# Patient Record
Sex: Female | Born: 1995 | Race: White | Hispanic: No | Marital: Single | Smoking: Never smoker
Health system: Southern US, Community
[De-identification: ages and names within clinical notes are randomized; demographics above are authoritative.]

## PROBLEM LIST (undated history)

## (undated) DIAGNOSIS — R109 Unspecified abdominal pain: Secondary | ICD-10-CM

## (undated) DIAGNOSIS — Z803 Family history of malignant neoplasm of breast: Secondary | ICD-10-CM

## (undated) DIAGNOSIS — G510 Bell's palsy: Secondary | ICD-10-CM

## (undated) DIAGNOSIS — L409 Psoriasis, unspecified: Secondary | ICD-10-CM

## (undated) DIAGNOSIS — L404 Guttate psoriasis: Secondary | ICD-10-CM

## (undated) HISTORY — PX: NO PAST SURGERIES: SHX2092

## (undated) HISTORY — PX: WISDOM TOOTH EXTRACTION: SHX21

## (undated) HISTORY — DX: Bell's palsy: G51.0

## (undated) HISTORY — DX: Family history of malignant neoplasm of breast: Z80.3

## (undated) HISTORY — DX: Unspecified abdominal pain: R10.9

## (undated) HISTORY — DX: Guttate psoriasis: L40.4

---
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
## 2017-03-25 NOTE — Telephone Encounter (Signed)
RN to get more info on bladder sx. At her age, she may need to see urology instead and can do ref before her annual appt.

## 2017-03-25 NOTE — Telephone Encounter (Signed)
Pt has scheduled annual exam for 10/1st.  She also wants to talk to you about bladder control options.  Front desk told her to ask on nurse line if she needs a longer appt time.  Please adv. [phone]

## 2017-03-26 NOTE — Telephone Encounter (Signed)
Mail box is full. Will try later

## 2017-03-31 NOTE — Telephone Encounter (Signed)
This encounter was created in error - please disregard.

## 2017-06-08 ENCOUNTER — Ambulatory Visit: Payer: Self-pay | Admitting: Obstetrics and Gynecology

## 2019-02-25 ENCOUNTER — Ambulatory Visit (INDEPENDENT_AMBULATORY_CARE_PROVIDER_SITE_OTHER): Payer: BC Managed Care – PPO | Admitting: Obstetrics and Gynecology

## 2019-02-25 ENCOUNTER — Encounter: Payer: Self-pay | Admitting: Obstetrics and Gynecology

## 2019-02-25 ENCOUNTER — Other Ambulatory Visit (HOSPITAL_COMMUNITY)
Admission: RE | Admit: 2019-02-25 | Discharge: 2019-02-25 | Disposition: A | Discharge status: Home / Self Care | Payer: BC Managed Care – PPO | Source: Ambulatory Visit | Attending: Obstetrics and Gynecology | Admitting: Obstetrics and Gynecology

## 2019-02-25 ENCOUNTER — Other Ambulatory Visit: Payer: Self-pay

## 2019-02-25 VITALS — BP 110/62 | Ht 63.0 in | Wt 142.0 lb

## 2019-02-25 DIAGNOSIS — Z124 Encounter for screening for malignant neoplasm of cervix: Secondary | ICD-10-CM

## 2019-02-25 DIAGNOSIS — Z113 Encounter for screening for infections with a predominantly sexual mode of transmission: Secondary | ICD-10-CM | POA: Insufficient documentation

## 2019-02-25 DIAGNOSIS — Z3009 Encounter for other general counseling and advice on contraception: Secondary | ICD-10-CM

## 2019-02-25 DIAGNOSIS — Z01419 Encounter for gynecological examination (general) (routine) without abnormal findings: Secondary | ICD-10-CM

## 2019-02-25 MED ORDER — LEVONORGESTREL-ETHINYL ESTRAD 0.1-20 MG-MCG PO TABS: 1.0000 | ORAL_TABLET | Freq: Every day | ORAL | 11 refills | Status: DC

## 2019-02-25 NOTE — Progress Notes (Signed)
Gynecology Annual Exam  PCP: Gregary Signs, MD  Chief Complaint:  Chief Complaint  Patient presents with  . Gynecologic Exam    Wants to get on BC    History of Present Illness: Patient is a 23 y.o. G0P0000 presents for annual exam. The patient has no complaints today.   LMP: Patient's last menstrual period was 02/24/2019 (exact date). Menarche:14 Average Interval: regular, 28 days Duration of flow: 4 days Heavy Menses: moderate Clots: no Intermenstrual Bleeding: no Postcoital Bleeding: no Dysmenorrhea: moderate  The patient is sexually active. She currently uses none for contraception. She denies dyspareunia.  The patient does not perform self breast exams.  There is notable family history of breast or ovarian cancer in her family.  The patient wears seatbelts: yes.  The patient has regular exercise: yes.    The patient denies current symptoms of depression.    Review of Systems: Review of Systems  Constitutional: Negative for chills, fever, malaise/fatigue and weight loss.  HENT: Negative for congestion, hearing loss and sinus pain.   Eyes: Negative for blurred vision and double vision.  Respiratory: Negative for cough, sputum production, shortness of breath and wheezing.   Cardiovascular: Negative for chest pain, palpitations, orthopnea and leg swelling.  Gastrointestinal: Negative for abdominal pain, constipation, diarrhea, nausea and vomiting.  Genitourinary: Negative for dysuria, flank pain, frequency, hematuria and urgency.  Musculoskeletal: Negative for back pain, falls and joint pain.  Skin: Negative for itching and rash.  Neurological: Negative for dizziness and headaches.  Psychiatric/Behavioral: Negative for depression, substance abuse and suicidal ideas. The patient is not nervous/anxious.     Past Medical History:  Past Medical History:  Diagnosis Date  . Abdominal pain   . Bell palsy     Past Surgical History:  History reviewed. No pertinent  surgical history.  Gynecologic History:  Patient's last menstrual period was 02/24/2019 (exact date). Contraception: none Last Pap: never  Obstetric History: G0P0000  Family History:  Family History  Problem Relation Age of Onset  . Migraines Brother   . Migraines Maternal Aunt     Social History:  Social History   Socioeconomic History  . Marital status: Single    Spouse name: Not on file  . Number of children: Not on file  . Years of education: Not on file  . Highest education level: Not on file  Occupational History  . Not on file  Social Needs  . Financial resource strain: Not on file  . Food insecurity    Worry: Not on file    Inability: Not on file  . Transportation needs    Medical: Not on file    Non-medical: Not on file  Tobacco Use  . Smoking status: Never Smoker  . Smokeless tobacco: Never Used  Substance and Sexual Activity  . Alcohol use: No  . Drug use: No  . Sexual activity: Yes    Birth control/protection: None, Condom  Lifestyle  . Physical activity    Days per week: Not on file    Minutes per session: Not on file  . Stress: Not on file  Relationships  . Social Herbalist on phone: Not on file    Gets together: Not on file    Attends religious service: Not on file    Active member of club or organization: Not on file    Attends meetings of clubs or organizations: Not on file    Relationship status: Not on file  .  Intimate partner violence    Fear of current or ex partner: Not on file    Emotionally abused: Not on file    Physically abused: Not on file    Forced sexual activity: Not on file  Other Topics Concern  . Not on file  Social History Narrative  . Not on file    Allergies:  Allergies  Allergen Reactions  . Amoxicillin Rash    Medications: Prior to Admission medications   Medication Sig Start Date End Date Taking? Authorizing Provider  loratadine (CLARITIN) 10 MG tablet Take 10 mg by mouth daily.   Yes  [provider]  Multiple Vitamin (MULTIVITAMIN) tablet Take 1 tablet by mouth daily.   Yes [provider]  sertraline (ZOLOFT) 50 MG tablet TAKE 1 TABLET BY MOUTH ONCE DAILY (TAKE WITH A 25MG  TABLET FOR A TOTAL DAILY DOSE OF 75MG ) 11/24/18  Yes [provider]    Physical Exam Vitals: Blood pressure 110/62, height 5\' 3"  (1.6 m), weight 142 lb (64.4 kg), last menstrual period 02/24/2019.  General: NAD HEENT: normocephalic, anicteric Thyroid: no enlargement, no palpable nodules Pulmonary: No increased work of breathing, CTAB Cardiovascular: RRR, distal pulses 2+ Breast: Breast symmetrical, no tenderness, no palpable nodules or masses, no skin or nipple retraction present, no nipple discharge.  No axillary or supraclavicular lymphadenopathy. Abdomen: NABS, soft, non-tender, non-distended.  Umbilicus without lesions.  No hepatomegaly, splenomegaly or masses palpable. No evidence of hernia  Genitourinary:  External: Normal external female genitalia.  Normal urethral meatus, normal Bartholin's and Skene's glands.    Vagina: Normal vaginal mucosa, no evidence of prolapse.    Cervix: Grossly normal in appearance, no bleeding  Uterus: Non-enlarged, mobile, normal contour.  No CMT  Adnexa: ovaries non-enlarged, no adnexal masses  Rectal: deferred  Lymphatic: no evidence of inguinal lymphadenopathy Extremities: no edema, erythema, or tenderness Neurologic: Grossly intact Psychiatric: mood appropriate, affect full  Female chaperone present for pelvic and breast  portions of the physical exam    Assessment: 23 y.o. G0P0000 routine annual exam  Plan: Problem List Items Addressed This Visit    None    Visit Diagnoses    Screening for cervical cancer    -  Primary   Relevant Orders   Cytology - PAP   Screening examination for STD (sexually transmitted disease)       Relevant Orders   Cervicovaginal ancillary only   Birth control counseling       Relevant  Medications   levonorgestrel-ethinyl estradiol (ALESSE) 0.1-20 MG-MCG tablet      1) 4) Gardasil Series discussed and if applicable offered to patient - Patient thinks that she has previously completed 3 shot series   2) STI screening  was offered and accepted  3)  ASCCP guidelines and rational discussed.  Patient opts for every 3 years screening interval  4) Contraception - the patient is currently using  none.  She is interested in starting Contraception: Would like to start with the contraceptive pill We discussed safe sex practices to reduce her furture risk of STI's.    5) Discussed hereditary breast cancer screening- patient will consider.   6)  Return in about 1 year (around 02/25/2020).  Adelene Idlerhristanna Irma Roulhac MD [facility], [facility] 02/25/2019 1:04 PM

## 2019-02-25 NOTE — Patient Instructions (Signed)

## 2019-02-28 LAB — CERVICOVAGINAL ANCILLARY ONLY
Chlamydia: NEGATIVE
Neisseria Gonorrhea: NEGATIVE

## 2019-03-01 LAB — CYTOLOGY - PAP: Diagnosis: NEGATIVE

## 2019-03-01 NOTE — Progress Notes (Signed)
Negative, Released to mychart 

## 2019-03-01 NOTE — Progress Notes (Signed)
NIL, released to mychart

## 2020-02-03 ENCOUNTER — Other Ambulatory Visit: Payer: Self-pay

## 2020-02-03 DIAGNOSIS — Z3009 Encounter for other general counseling and advice on contraception: Secondary | ICD-10-CM

## 2020-02-03 MED ORDER — LEVONORGESTREL-ETHINYL ESTRAD 0.1-20 MG-MCG PO TABS: 1.0000 | ORAL_TABLET | Freq: Every day | ORAL | 0 refills | Status: DC

## 2020-03-01 ENCOUNTER — Other Ambulatory Visit (HOSPITAL_COMMUNITY)
Admission: RE | Admit: 2020-03-01 | Discharge: 2020-03-01 | Disposition: A | Discharge status: Home / Self Care | Payer: BC Managed Care – PPO | Source: Ambulatory Visit | Attending: Obstetrics and Gynecology | Admitting: Obstetrics and Gynecology

## 2020-03-01 ENCOUNTER — Ambulatory Visit (INDEPENDENT_AMBULATORY_CARE_PROVIDER_SITE_OTHER): Payer: BC Managed Care – PPO | Admitting: Obstetrics and Gynecology

## 2020-03-01 ENCOUNTER — Encounter: Payer: Self-pay | Admitting: Obstetrics and Gynecology

## 2020-03-01 ENCOUNTER — Other Ambulatory Visit: Payer: Self-pay

## 2020-03-01 VITALS — BP 116/72 | HR 88 | Resp 16 | Ht 63.0 in | Wt 149.4 lb

## 2020-03-01 DIAGNOSIS — Z0000 Encounter for general adult medical examination without abnormal findings: Secondary | ICD-10-CM

## 2020-03-01 DIAGNOSIS — Z113 Encounter for screening for infections with a predominantly sexual mode of transmission: Secondary | ICD-10-CM

## 2020-03-01 DIAGNOSIS — Z3009 Encounter for other general counseling and advice on contraception: Secondary | ICD-10-CM

## 2020-03-01 DIAGNOSIS — Z01419 Encounter for gynecological examination (general) (routine) without abnormal findings: Secondary | ICD-10-CM

## 2020-03-01 MED ORDER — LEVONORGESTREL-ETHINYL ESTRAD 0.1-20 MG-MCG PO TABS: 1.0000 | ORAL_TABLET | Freq: Every day | ORAL | 12 refills | Status: DC

## 2020-03-01 NOTE — Patient Instructions (Signed)
Institute of Medicine Recommended Dietary Allowances for Calcium and Vitamin D  Age (yr) Calcium Recommended Dietary Allowance (mg/day) Vitamin D Recommended Dietary Allowance (international units/day)  9-18 1,300 600  19-50 1,000 600  51-70 1,200 600  71 and older 1,200 800  Data from Institute of Medicine. Dietary reference intakes: calcium, vitamin D. [place], [place]: National Academies Press; 2011.     Exercising to Stay Healthy To become healthy and stay healthy, it is recommended that you do moderate-intensity and vigorous-intensity exercise. You can tell that you are exercising at a moderate intensity if your heart starts beating faster and you start breathing faster but can still hold a conversation. You can tell that you are exercising at a vigorous intensity if you are breathing much harder and faster and cannot hold a conversation while exercising. Exercising regularly is important. It has many health benefits, such as:  Improving overall fitness, flexibility, and endurance.  Increasing bone density.  Helping with weight control.  Decreasing body fat.  Increasing muscle strength.  Reducing stress and tension.  Improving overall health. How often should I exercise? Choose an activity that you enjoy, and set realistic goals. Your health care provider can help you make an activity plan that works for you. Exercise regularly as told by your health care provider. This may include:  Doing strength training two times a week, such as: ? Lifting weights. ? Using resistance bands. ? Push-ups. ? Sit-ups. ? Yoga.  Doing a certain intensity of exercise for a given amount of time. Choose from these options: ? A total of 150 minutes of moderate-intensity exercise every week. ? A total of 75 minutes of vigorous-intensity exercise every week. ? A mix of moderate-intensity and vigorous-intensity exercise every week. Children, pregnant women, people who have not exercised  regularly, people who are overweight, and older adults may need to talk with a health care provider about what activities are safe to do. If you have a medical condition, be sure to talk with your health care provider before you start a new exercise program. What are some exercise ideas? Moderate-intensity exercise ideas include:  Walking 1 mile (1.6 km) in about 15 minutes.  Biking.  Hiking.  Golfing.  Dancing.  Water aerobics. Vigorous-intensity exercise ideas include:  Walking 4.5 miles (7.2 km) or more in about 1 hour.  Jogging or running 5 miles (8 km) in about 1 hour.  Biking 10 miles (16.1 km) or more in about 1 hour.  Lap swimming.  Roller-skating or in-line skating.  Cross-country skiing.  Vigorous competitive sports, such as football, basketball, and soccer.  Jumping rope.  Aerobic dancing. What are some everyday activities that can help me to get exercise?  Yard work, such as: ? Pushing a lawn mower. ? Raking and bagging leaves.  Washing your car.  Pushing a stroller.  Shoveling snow.  Gardening.  Washing windows or floors. How can I be more active in my day-to-day activities?  Use stairs instead of an elevator.  Take a walk during your lunch break.  If you drive, park your car farther away from your work or school.  If you take public transportation, get off one stop early and walk the rest of the way.  Stand up or walk around during all of your indoor phone calls.  Get up, stretch, and walk around every 30 minutes throughout the day.  Enjoy exercise with a friend. Support to continue exercising will help you keep a regular routine of activity. What guidelines can   I follow while exercising?  Before you start a new exercise program, talk with your health care provider.  Do not exercise so much that you hurt yourself, feel dizzy, or get very short of breath.  Wear comfortable clothes and wear shoes with good support.  Drink plenty of  water while you exercise to prevent dehydration or heat stroke.  Work out until your breathing and your heartbeat get faster. Where to find more information  U.S. Department of Health and Human Services: www.hhs.gov  Centers for Disease Control and Prevention (CDC): www.cdc.gov Summary  Exercising regularly is important. It will improve your overall fitness, flexibility, and endurance.  Regular exercise also will improve your overall health. It can help you control your weight, reduce stress, and improve your bone density.  Do not exercise so much that you hurt yourself, feel dizzy, or get very short of breath.  Before you start a new exercise program, talk with your health care provider. This information is not intended to replace advice given to you by your health care provider. Make sure you discuss any questions you have with your health care provider. Document Revised: 08/07/2017 Document Reviewed: 07/16/2017 Elsevier Patient Education  2020 Elsevier Inc.   Budget-Friendly Healthy Eating There are many ways to save money at the grocery store and continue to eat healthy. You can be successful if you:  Plan meals according to your budget.  Make a grocery list and only purchase food according to your grocery list.  Prepare food yourself. What are tips for following this plan?  Reading food labels  Compare food labels between brand name foods and the store brand. Often the nutritional value is the same, but the store brand is lower cost.  Look for products that do not have added sugar, fat, or salt (sodium). These often cost the same but are healthier for you. Products may be labeled as: ? Sugar-free. ? Nonfat. ? Low-fat. ? Sodium-free. ? Low-sodium.  Look for lean ground beef labeled as at least 92% lean and 8% fat. Shopping  Buy only the items on your grocery list and go only to the areas of the store that have the items on your list.  Use coupons only for foods  and brands you normally buy. Avoid buying items you wouldn't normally buy simply because they are on sale.  Check online and in newspapers for weekly deals.  Buy healthy items from the bulk bins when available, such as herbs, spices, flour, pasta, nuts, and dried fruit.  Buy fruits and vegetables that are in season. Prices are usually lower on in-season produce.  Look at the unit price on the price tag. Use it to compare different brands and sizes to find out which item is the best deal.  Choose healthy items that are often low-cost, such as carrots, potatoes, apples, bananas, and oranges. Dried or canned beans are a low-cost protein source.  Buy in bulk and freeze extra food. Items you can buy in bulk include meats, fish, poultry, frozen fruits, and frozen vegetables.  Avoid buying "ready-to-eat" foods, such as pre-cut fruits and vegetables and pre-made salads.  If possible, shop around to discover where you can find the best prices. Consider other retailers such as dollar stores, larger wholesale stores, local fruit and vegetable stands, and farmers markets.  Do not shop when you are hungry. If you shop while hungry, it may be hard to stick to your list and budget.  Resist impulse buying. Use your grocery list as   your official plan for the week.  Buy a variety of vegetables and fruits by purchasing fresh, frozen, and canned items.  Look at the top and bottom shelves for deals. Foods at eye level (eye level of an adult or child) are usually more expensive.  Be efficient with your time when shopping. The more time you spend at the store, the more money you are likely to spend.  To save money when choosing more expensive foods like meats and dairy: ? Choose cheaper cuts of meat, such as bone-in chicken thighs and drumsticks instead of skinless and boneless chicken. When you are ready to prepare the chicken, you can remove the skin yourself to make it healthier. ? Choose lean meats like  chicken or turkey instead of beef. ? Choose canned seafood, such as tuna, salmon, or sardines. ? Buy eggs as a low-cost source of protein. ? Buy dried beans and peas, such as lentils, split peas, or kidney beans instead of meats. Dried beans and peas are a good alternative source of protein. ? Buy the larger tubs of yogurt instead of individual-sized containers.  Choose water instead of sodas and other sweetened beverages.  Avoid buying chips, cookies, and other "junk food." These items are usually expensive and not healthy. Cooking  Make extra food and freeze the extras in meal-sized containers or in individual portions for fast meals and snacks.  Pre-cook on days when you have extra time to prepare meals in advance. You can keep these meals in the fridge or freezer and reheat for a quick meal.  When you come home from the grocery store, wash, peel, and cut fruits and vegetables so they are ready to use and eat. This will help reduce food waste. Meal planning  Do not eat out or get fast food. Prepare food at home.  Make a grocery list and make sure to bring it with you to the store. If you have a smart phone, you could use your phone to create your shopping list.  Plan meals and snacks according to a grocery list and budget you create.  Use leftovers in your meal plan for the week.  Look for recipes where you can cook once and make enough food for two meals.  Include budget-friendly meals like stews, casseroles, and stir-fry dishes.  Try some meatless meals or try "no cook" meals like salads.  Make sure that half your plate is filled with fruits or vegetables. Choose from fresh, frozen, or canned fruits and vegetables. If eating canned, remember to rinse them before eating. This will remove any excess salt added for packaging. Summary  Eating healthy on a budget is possible if you plan your meals according to your budget, purchase according to your budget and grocery list, and  prepare food yourself.  Tips for buying more food on a limited budget include buying generic brands, using coupons only for foods you normally buy, and buying healthy items from the bulk bins when available.  Tips for buying cheaper food to replace expensive food include choosing cheaper, lean cuts of meat, and buying dried beans and peas. This information is not intended to replace advice given to you by your health care provider. Make sure you discuss any questions you have with your health care provider. Document Revised: 08/26/2017 Document Reviewed: 08/26/2017 Elsevier Patient Education  2020 Elsevier Inc.   Bone Health Bones protect organs, store calcium, anchor muscles, and support the whole body. Keeping your bones strong is important, especially as you   get older. You can take actions to help keep your bones strong and healthy. Why is keeping my bones healthy important?  Keeping your bones healthy is important because your body constantly replaces bone cells. Cells get old, and new cells take their place. As we age, we lose bone cells because the body may not be able to make enough new cells to replace the old cells. The amount of bone cells and bone tissue you have is referred to as bone mass. The higher your bone mass, the stronger your bones. The aging process leads to an overall loss of bone mass in the body, which can increase the likelihood of:  Joint pain and stiffness.  Broken bones.  A condition in which the bones become weak and brittle (osteoporosis). A large decline in bone mass occurs in older adults. In women, it occurs about the time of menopause. What actions can I take to keep my bones healthy? Good health habits are important for maintaining healthy bones. This includes eating nutritious foods and exercising regularly. To have healthy bones, you need to get enough of the right minerals and vitamins. Most nutrition experts recommend getting these nutrients from the  foods that you eat. In some cases, taking supplements may also be recommended. Doing certain types of exercise is also important for bone health. What are the nutritional recommendations for healthy bones?  Eating a well-balanced diet with plenty of calcium and vitamin D will help to protect your bones. Nutritional recommendations vary from person to person. Ask your health care provider what is healthy for you. Here are some general guidelines. Get enough calcium Calcium is the most important (essential) mineral for bone health. Most people can get enough calcium from their diet, but supplements may be recommended for people who are at risk for osteoporosis. Good sources of calcium include:  Dairy products, such as low-fat or nonfat milk, cheese, and yogurt.  Dark green leafy vegetables, such as bok choy and broccoli.  Calcium-fortified foods, such as orange juice, cereal, bread, soy beverages, and tofu products.  Nuts, such as almonds. Follow these recommended amounts for daily calcium intake:  Children, age 1-3: 700 mg.  Children, age 4-8: 1,000 mg.  Children, age 9-13: 1,300 mg.  Teens, age 14-18: 1,300 mg.  Adults, age 19-50: 1,000 mg.  Adults, age 51-70: ? Men: 1,000 mg. ? Women: 1,200 mg.  Adults, age 71 or older: 1,200 mg.  Pregnant and breastfeeding females: ? Teens: 1,300 mg. ? Adults: 1,000 mg. Get enough vitamin D Vitamin D is the most essential vitamin for bone health. It helps the body absorb calcium. Sunlight stimulates the skin to make vitamin D, so be sure to get enough sunlight. If you live in a cold climate or you do not get outside often, your health care provider may recommend that you take vitamin D supplements. Good sources of vitamin D in your diet include:  Egg yolks.  Saltwater fish.  Milk and cereal fortified with vitamin D. Follow these recommended amounts for daily vitamin D intake:  Children and teens, age 1-18: 600 international  units.  Adults, age 50 or younger: 400-800 international units.  Adults, age 51 or older: 800-1,000 international units. Get other important nutrients Other nutrients that are important for bone health include:  Phosphorus. This mineral is found in meat, poultry, dairy foods, nuts, and legumes. The recommended daily intake for adult men and adult women is 700 mg.  Magnesium. This mineral is found in seeds, nuts, dark   green vegetables, and legumes. The recommended daily intake for adult men is 400-420 mg. For adult women, it is 310-320 mg.  Vitamin K. This vitamin is found in green leafy vegetables. The recommended daily intake is 120 mg for adult men and 90 mg for adult women. What type of physical activity is best for building and maintaining healthy bones? Weight-bearing and strength-building activities are important for building and maintaining healthy bones. Weight-bearing activities cause muscles and bones to work against gravity. Strength-building activities increase the strength of the muscles that support bones. Weight-bearing and muscle-building activities include:  Walking and hiking.  Jogging and running.  Dancing.  Gym exercises.  Lifting weights.  Tennis and racquetball.  Climbing stairs.  Aerobics. Adults should get at least 30 minutes of moderate physical activity on most days. Children should get at least 60 minutes of moderate physical activity on most days. Ask your health care provider what type of exercise is best for you. How can I find out if my bone mass is low? Bone mass can be measured with an X-ray test called a bone mineral density (BMD) test. This test is recommended for all women who are age 65 or older. It may also be recommended for:  Men who are age 70 or older.  People who are at risk for osteoporosis because of: ? Having bones that break easily. ? Having a long-term disease that weakens bones, such as kidney disease or rheumatoid  arthritis. ? Having menopause earlier than normal. ? Taking medicine that weakens bones, such as steroids, thyroid hormones, or hormone treatment for breast cancer or prostate cancer. ? Smoking. ? Drinking three or more alcoholic drinks a day. If you find that you have a low bone mass, you may be able to prevent osteoporosis or further bone loss by changing your diet and lifestyle. Where can I find more information? For more information, check out the following websites:  National Osteoporosis Foundation: www.nof.org/patients  National Institutes of Health: www.bones.nih.gov  International Osteoporosis Foundation: www.iofbonehealth.org Summary  The aging process leads to an overall loss of bone mass in the body, which can increase the likelihood of broken bones and osteoporosis.  Eating a well-balanced diet with plenty of calcium and vitamin D will help to protect your bones.  Weight-bearing and strength-building activities are also important for building and maintaining strong bones.  Bone mass can be measured with an X-ray test called a bone mineral density (BMD) test. This information is not intended to replace advice given to you by your health care provider. Make sure you discuss any questions you have with your health care provider. Document Revised: 09/21/2017 Document Reviewed: 09/21/2017 Elsevier Patient Education  2020 Elsevier Inc.   

## 2020-03-01 NOTE — Progress Notes (Signed)
Gynecology Annual Exam  PCP: Gregary Signs, MD  Chief Complaint:  Chief Complaint  Patient presents with  . Gynecologic Exam    History of Present Illness: Patient is a 24 y.o. G0P0000 presents for annual exam. The patient has no complaints today.   LMP: Patient's last menstrual period was 02/29/2020. Average Interval: regular, 28 days Duration of flow: 5 days Heavy Menses: no Clots: no Intermenstrual Bleeding: no Postcoital Bleeding: no Dysmenorrhea: no  The patient is sexually active. She currently uses OCP (estrogen/progesterone) for contraception. She denies dyspareunia.  There is no notable family history of breast or ovarian cancer in her family.  The patient wears seatbelts: no.  The patient has regular exercise: yes.    The patient denies current symptoms of depression.    Review of Systems: Review of Systems  Constitutional: Negative for chills, fever, malaise/fatigue and weight loss.  HENT: Negative for congestion, hearing loss and sinus pain.   Eyes: Negative for blurred vision and double vision.  Respiratory: Negative for cough, sputum production, shortness of breath and wheezing.   Cardiovascular: Negative for chest pain, palpitations, orthopnea and leg swelling.  Gastrointestinal: Negative for abdominal pain, constipation, diarrhea, nausea and vomiting.  Genitourinary: Negative for dysuria, flank pain, frequency, hematuria and urgency.  Musculoskeletal: Negative for back pain, falls and joint pain.  Skin: Negative for itching and rash.  Neurological: Negative for dizziness and headaches.  Psychiatric/Behavioral: Negative for depression, substance abuse and suicidal ideas. The patient is not nervous/anxious.     Past Medical History:  Patient Active Problem List   Diagnosis Date Noted  . Migraine variant 12/03/2012  . Nausea 09/28/2012  . Generalized abdominal pain     Past Surgical History:  History reviewed. No pertinent surgical  history.  Gynecologic History:  Patient's last menstrual period was 02/29/2020. Contraception: OCP (estrogen/progesterone) Last Pap: Results were: 2020 NIL   Obstetric History: G0P0000  Family History:  Family History  Problem Relation Age of Onset  . Migraines Brother   . Migraines Maternal Aunt     Social History:  Social History   Socioeconomic History  . Marital status: Single    Spouse name: Not on file  . Number of children: Not on file  . Years of education: Not on file  . Highest education level: Not on file  Occupational History  . Not on file  Tobacco Use  . Smoking status: Never Smoker  . Smokeless tobacco: Never Used  Vaping Use  . Vaping Use: Never used  Substance and Sexual Activity  . Alcohol use: No  . Drug use: No  . Sexual activity: Yes    Birth control/protection: None, Condom  Other Topics Concern  . Not on file  Social History Narrative  . Not on file   Social Determinants of Health   Financial Resource Strain:   . Difficulty of Paying Living Expenses:   Food Insecurity:   . Worried About Charity fundraiser in the Last Year:   . Arboriculturist in the Last Year:   Transportation Needs:   . Film/video editor (Medical):   Marland Kitchen Lack of Transportation (Non-Medical):   Physical Activity:   . Days of Exercise per Week:   . Minutes of Exercise per Session:   Stress:   . Feeling of Stress :   Social Connections:   . Frequency of Communication with Friends and Family:   . Frequency of Social Gatherings with Friends and Family:   .  Attends Religious Services:   . Active Member of Clubs or Organizations:   . Attends Banker Meetings:   Marland Kitchen Marital Status:   Intimate Partner Violence:   . Fear of Current or Ex-Partner:   . Emotionally Abused:   Marland Kitchen Physically Abused:   . Sexually Abused:     Allergies:  Allergies  Allergen Reactions  . Amoxicillin Rash    Medications: Prior to Admission medications   Medication Sig  Start Date End Date Taking? Authorizing Provider  levonorgestrel-ethinyl estradiol (ALESSE) 0.1-20 MG-MCG tablet Take 1 tablet by mouth daily. 03/01/20  Yes Jourdin Connors R, MD  loratadine (CLARITIN) 10 MG tablet Take 10 mg by mouth daily.   Yes [provider]  Multiple Vitamin (MULTIVITAMIN) tablet Take 1 tablet by mouth daily.   Yes [provider]  sertraline (ZOLOFT) 50 MG tablet TAKE 1 TABLET BY MOUTH ONCE DAILY (TAKE WITH A 25MG  TABLET FOR A TOTAL DAILY DOSE OF 75MG ) 11/24/18  Yes [provider]    Physical Exam Vitals: Blood pressure 116/72, pulse 88, resp. rate 16, height 5\' 3"  (1.6 m), weight 149 lb 6.4 oz (67.8 kg), last menstrual period 02/29/2020, SpO2 98 %.  General: NAD HEENT: normocephalic, anicteric Thyroid: no enlargement, no palpable nodules Pulmonary: No increased work of breathing, CTAB Cardiovascular: RRR, distal pulses 2+ Breast: Breast symmetrical, no tenderness, no palpable nodules or masses, no skin or nipple retraction present, no nipple discharge.  No axillary or supraclavicular lymphadenopathy. Abdomen: NABS, soft, non-tender, non-distended.  Umbilicus without lesions.  No hepatomegaly, splenomegaly or masses palpable. No evidence of hernia  Genitourinary:  External: Normal external female genitalia.  Normal urethral meatus, normal Bartholin's and Skene's glands.    Vagina: Normal vaginal mucosa, no evidence of prolapse.    Cervix: Grossly normal in appearance, no bleeding  Uterus: Non-enlarged, mobile, normal contour.  No CMT  Adnexa: ovaries non-enlarged, no adnexal masses  Rectal: deferred  Lymphatic: no evidence of inguinal lymphadenopathy Extremities: no edema, erythema, or tenderness Neurologic: Grossly intact Psychiatric: mood appropriate, affect full  Female chaperone present for pelvic and breast  portions of the physical exam  Assessment: 24 y.o. G0P0000 routine annual exam  Plan: Problem List Items Addressed  This Visit    None    Visit Diagnoses    Encounter for annual routine gynecological examination    -  Primary   Birth control counseling       Relevant Medications   levonorgestrel-ethinyl estradiol (ALESSE) 0.1-20 MG-MCG tablet   Health maintenance examination       Screen for STD (sexually transmitted disease)       Relevant Orders   Cervicovaginal ancillary only      1) 4) Gardasil Series discussed and if applicable offered to patient - Patient has previously completed 3 shot series   2) STI screening  was offered and accepted  3)  ASCCP guidelines and rational discussed.  Patient opts for every 3 years screening interval  4) Contraception - the patient is currently using  OCP (estrogen/progesterone).  She is happy with her current form of contraception and plans to continue We discussed safe sex practices to reduce her furture risk of STI's.    5) Return in about 1 year (around 03/01/2021) for annual.  21 MD, 06-04-2005 OB/GYN, [facility] 03/01/2020 5:41 PM

## 2020-03-05 LAB — CERVICOVAGINAL ANCILLARY ONLY
Chlamydia: NEGATIVE
Comment: NEGATIVE
Comment: NORMAL
Neisseria Gonorrhea: NEGATIVE

## 2020-06-27 ENCOUNTER — Other Ambulatory Visit: Payer: Self-pay | Admitting: Obstetrics

## 2020-06-27 DIAGNOSIS — F411 Generalized anxiety disorder: Secondary | ICD-10-CM

## 2020-06-27 MED ORDER — SERTRALINE HCL 50 MG PO TABS: ORAL_TABLET | ORAL | 11 refills | Status: DC

## 2020-06-27 NOTE — Progress Notes (Signed)
Patient had called the [place] [facility] to request a refill on her Sertraline. Takes 75 mg daily, by using one 50 mg tab and adding a half tab. Dr. Jerene Pitch is out of town. I called this pateint and had a conversation with her. She has had no medication for several weeks, and has been feeling withdrawal sxs.  In the past, her PCP has prescribed this for her, but she has made several calls to that office and not heard back. I have sent in a refill for sertraline tabs, 50 mg to the pharmacy. She will take 1.5 tabs daily. Sig is for 60 tabs with 11 refills.  Melissa Donaldson, CNM  06/27/2020 11:50 AM

## 2021-01-07 ENCOUNTER — Telehealth: Payer: Self-pay

## 2021-01-07 DIAGNOSIS — Z3009 Encounter for other general counseling and advice on contraception: Secondary | ICD-10-CM

## 2021-01-07 MED ORDER — LEVONORGESTREL-ETHINYL ESTRAD 0.1-20 MG-MCG PO TABS: 1.0000 | ORAL_TABLET | Freq: Every day | ORAL | 12 refills | Status: DC

## 2021-01-07 NOTE — Telephone Encounter (Signed)
Pt calling; has lost her bc pill back for this month; can another one for this month be sent in so she can stay on track?  [phone]

## 2021-01-07 NOTE — Telephone Encounter (Signed)
You can call her pharmacy and authorize an early refill under my name, thank you

## 2021-03-05 ENCOUNTER — Encounter: Payer: Self-pay | Admitting: Obstetrics and Gynecology

## 2021-03-05 ENCOUNTER — Ambulatory Visit (INDEPENDENT_AMBULATORY_CARE_PROVIDER_SITE_OTHER): Payer: BC Managed Care – PPO | Admitting: Obstetrics and Gynecology

## 2021-03-05 ENCOUNTER — Other Ambulatory Visit: Payer: Self-pay

## 2021-03-05 VITALS — BP 118/71 | Ht 63.0 in | Wt 166.0 lb

## 2021-03-05 DIAGNOSIS — R5383 Other fatigue: Secondary | ICD-10-CM

## 2021-03-05 DIAGNOSIS — Z0000 Encounter for general adult medical examination without abnormal findings: Secondary | ICD-10-CM

## 2021-03-05 DIAGNOSIS — Z01419 Encounter for gynecological examination (general) (routine) without abnormal findings: Secondary | ICD-10-CM

## 2021-03-05 DIAGNOSIS — Z1239 Encounter for other screening for malignant neoplasm of breast: Secondary | ICD-10-CM

## 2021-03-05 DIAGNOSIS — R635 Abnormal weight gain: Secondary | ICD-10-CM

## 2021-03-05 DIAGNOSIS — Z9189 Other specified personal risk factors, not elsewhere classified: Secondary | ICD-10-CM

## 2021-03-05 DIAGNOSIS — Z3009 Encounter for other general counseling and advice on contraception: Secondary | ICD-10-CM

## 2021-03-05 DIAGNOSIS — Z130 Encounter for screening for diseases of the blood and blood-forming organs and certain disorders involving the immune mechanism: Secondary | ICD-10-CM

## 2021-03-05 MED ORDER — NORETHIN-ETH ESTRADIOL-FE 0.8-25 MG-MCG PO CHEW: 1.0000 | CHEWABLE_TABLET | Freq: Every day | ORAL | 11 refills | Status: DC

## 2021-03-05 NOTE — Progress Notes (Signed)
Gynecology Annual Exam  PCP: Erick Colace, MD  Chief Complaint:  Chief Complaint  Patient presents with   Gynecologic Exam    History of Present Illness: Patient is a 25 y.o. G0P0000 presents for annual exam. The patient has no complaints today.   LMP: Patient's last menstrual period was 02/26/2021. Average Interval: monthly Duration of flow: 4 days Heavy Menses: No Dysmenorrhea: No  She denies passage of large clots She denies sensations of gushing or flooding of blood. She denies accidents where she bleeds through her clothing. She denies that she changes a saturated pad or tampon more frequently than every hour.  She denies that pain from her periods limits her activities.  The patient does perform self breast exams.  There is notable family history of breast or ovarian cancer in her family.  The patient reports her exercise generally consists of walking and biking twice a weel .  The patient reports current symptoms of depression and anxiety are controlled with Zoloft 75 mg a day.  She currently has sufficient refills.  She had a somewhat stressful year with graduation, difficulty with friends, and breaking up with her boyfriend.  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  PHQ-9: 12 GAD-7: 9  Review of Systems: Review of Systems  Constitutional:  Negative for chills, fever, malaise/fatigue and weight loss.  HENT:  Negative for congestion, hearing loss and sinus pain.   Eyes:  Negative for blurred vision and double vision.  Respiratory:  Negative for cough, sputum production, shortness of breath and wheezing.   Cardiovascular:  Negative for chest pain, palpitations, orthopnea and leg swelling.  Gastrointestinal:  Negative for abdominal pain, constipation, diarrhea, nausea and vomiting.  Genitourinary:  Negative for dysuria, flank pain, frequency, hematuria and urgency.  Musculoskeletal:  Negative for back pain,  falls and joint pain.  Skin:  Negative for itching and rash.  Neurological:  Negative for dizziness and headaches.  Psychiatric/Behavioral:  Negative for depression, substance abuse and suicidal ideas. The patient is not nervous/anxious.    Past Medical History:  Past Medical History:  Diagnosis Date   Abdominal pain    Bell palsy     Past Surgical History:  History reviewed. No pertinent surgical history.  Gynecologic History:  Patient's last menstrual period was 02/26/2021. Menarche: 14  History of fibroids, polyps, or ovarian cysts? : no  History of PCOS? no Hstory of Endometriosis? no History of abnormal pap smears? no Have you had any sexually transmitted infections in the past? no  She reports HPV vaccination in the past.   Last Pap: Results were: 2020 NIL  She identifies as a female. She is sexually active with men.   She denies dyspareunia.  She currently uses condoms and OCP (estrogen/progesterone) for contraception.    Obstetric History: G0P0000  Family History:  Family History  Problem Relation Age of Onset   Migraines Brother    Migraines Maternal Aunt     Social History:  Social History   Socioeconomic History   Marital status: Single    Spouse name: Not on file   Number of children: Not on file   Years of education: Not on file   Highest education level: Not on file  Occupational History   Not on file  Tobacco Use   Smoking status: Never   Smokeless tobacco: Never  Vaping Use   Vaping Use: Never used  Substance and Sexual Activity  Alcohol use: No   Drug use: No   Sexual activity: Yes    Birth control/protection: None, Condom  Other Topics Concern   Not on file  Social History Narrative   Not on file   Social Determinants of Health   Financial Resource Strain: Not on file  Food Insecurity: Not on file  Transportation Needs: Not on file  Physical Activity: Not on file  Stress: Not on file  Social Connections: Not on file   Intimate Partner Violence: Not on file    Allergies:  Allergies  Allergen Reactions   Amoxicillin Rash    Medications: Prior to Admission medications   Medication Sig Start Date End Date Taking? Authorizing Provider  loratadine (CLARITIN) 10 MG tablet Take 10 mg by mouth daily.   Yes [provider]  Multiple Vitamin (MULTIVITAMIN) tablet Take 1 tablet by mouth daily.   Yes [provider]  Norethindrone-Ethinyl Estradiol-Fe (LAYOLIS FE) 0.8-25 MG-MCG tablet Chew 1 tablet by mouth daily. 03/05/21  Yes Johany Hansman R, MD  sertraline (ZOLOFT) 50 MG tablet TAKE 1 TABLET BY MOUTH ONCE DAILY (TAKE WITH A 25MG  TABLET FOR A TOTAL DAILY DOSE OF 75MG ) 06/27/20  Yes , CNM    Physical Exam Vitals: Blood pressure 118/71, height 5\' 3"  (1.6 m), weight 166 lb (75.3 kg), last menstrual period 02/26/2021.  Physical Exam Constitutional:      Appearance: She is well-developed.  Genitourinary:     Genitourinary Comments: External: Normal appearing vulva. No lesions noted.   Bimanual examination: Uterus midline, non-tender, normal in size, shape and contour.  No CMT. No adnexal masses. No adnexal tenderness. Pelvis not fixed.  Breast Exam: breast equal without skin changes, nipple discharge, breast lump or enlarged lymph nodes   HENT:     Head: Normocephalic and atraumatic.  Neck:     Thyroid: No thyromegaly.  Cardiovascular:     Rate and Rhythm: Normal rate and regular rhythm.     Heart sounds: Normal heart sounds.  Pulmonary:     Effort: Pulmonary effort is normal.     Breath sounds: Normal breath sounds.  Abdominal:     General: Bowel sounds are normal. There is no distension.     Palpations: Abdomen is soft. There is no mass.  Musculoskeletal:     Cervical back: Neck supple.  Neurological:     Mental Status: She is alert and oriented to person, place, and time.  Skin:    General: Skin is warm and dry.  Psychiatric:        Behavior: Behavior  normal.        Thought Content: Thought content normal.        Judgment: Judgment normal.  Vitals reviewed.     Female chaperone present for pelvic and breast  portions of the physical exam  Assessment: 25 y.o. G0P0000 routine annual exam  Plan: Problem List Items Addressed This Visit       Other   At increased risk of breast cancer   Other Visit Diagnoses     Birth control counseling    -  Primary   Relevant Medications   Norethindrone-Ethinyl Estradiol-Fe (LAYOLIS FE) 0.8-25 MG-MCG tablet   Fatigue, unspecified type       Relevant Orders   CBC   TSH + free T4   Ferritin   Vitamin D (25 hydroxy)   B12   Weight gain       Relevant Orders   TSH + free T4  Screening for deficiency anemia       Health maintenance examination       Encounter for annual routine gynecological examination       Encounter for screening breast examination       Encounter for gynecological examination without abnormal finding           1) STI screening was offered and declined  2) ASCCP guidelines and rational discussed.  Pap due in 2023  3) Contraception - continue OCP. Refill sent for alternative medication with different progestin given complaints of weight gain.  4) Routine healthcare maintenance including cholesterol, diabetes screening discussed Ordered today.  Patient reports some weight gain and fatigue.  5) Discussed family history of breast cancer and calculated patient's ibis score today.  Patient is at increased lifetime risk and recommended initiating yearly breast MRIs at the age of 25.  She is provided with information regarding my risk screening and will consider.    Adelene Idler MD, [facility], [facility] 03/05/2021 11:29 AM

## 2021-03-05 NOTE — Addendum Note (Signed)
Addended by: Adelene Idler on: 03/05/2021 11:30 AM   Modules accepted: Level of Service

## 2021-03-05 NOTE — Patient Instructions (Signed)
Institute of Medicine Recommended Dietary Allowances for Calcium and Vitamin D  Age (yr) Calcium Recommended Dietary Allowance (mg/day) Vitamin D Recommended Dietary Allowance (international units/day)  9-18 1,300 600  19-50 1,000 600  51-70 1,200 600  71 and older 1,200 800  Data from Institute of Medicine. Dietary reference intakes: calcium, vitamin D. [place], [place]: National Academies Press; 2011.   Exercising to Stay Healthy To become healthy and stay healthy, it is recommended that you do moderate-intensity and vigorous-intensity exercise. You can tell that you are exercising at a moderate intensity if your heart starts beating faster and you start breathing faster but can still hold a conversation. You can tell that you are exercising at a vigorous intensity if you are breathing much harder andfaster and cannot hold a conversation while exercising. Exercising regularly is important. It has many health benefits, such as: Improving overall fitness, flexibility, and endurance. Increasing bone density. Helping with weight control. Decreasing body fat. Increasing muscle strength. Reducing stress and tension. Improving overall health. How often should I exercise? Choose an activity that you enjoy, and set realistic goals. Your health careprovider can help you make an activity plan that works for you. Exercise regularly as told by your health care provider. This may include: Doing strength training two times a week, such as: Lifting weights. Using resistance bands. Push-ups. Sit-ups. Yoga. Doing a certain intensity of exercise for a given amount of time. Choose from these options: A total of 150 minutes of moderate-intensity exercise every week. A total of 75 minutes of vigorous-intensity exercise every week. A mix of moderate-intensity and vigorous-intensity exercise every week. Children, pregnant women, people who have not exercised regularly, people who are overweight, and  older adults may need to talk with a health care provider about what activities are safe to do. If you have a medical condition, be sureto talk with your health care provider before you start a new exercise program. What are some exercise ideas? Moderate-intensity exercise ideas include: Walking 1 mile (1.6 km) in about 15 minutes. Biking. Hiking. Golfing. Dancing. Water aerobics. Vigorous-intensity exercise ideas include: Walking 4.5 miles (7.2 km) or more in about 1 hour. Jogging or running 5 miles (8 km) in about 1 hour. Biking 10 miles (16.1 km) or more in about 1 hour. Lap swimming. Roller-skating or in-line skating. Cross-country skiing. Vigorous competitive sports, such as football, basketball, and soccer. Jumping rope. Aerobic dancing. What are some everyday activities that can help me to get exercise? Yard work, such as: Pushing a lawn mower. Raking and bagging leaves. Washing your car. Pushing a stroller. Shoveling snow. Gardening. Washing windows or floors. How can I be more active in my day-to-day activities? Use stairs instead of an elevator. Take a walk during your lunch break. If you drive, park your car farther away from your work or school. If you take public transportation, get off one stop early and walk the rest of the way. Stand up or walk around during all of your indoor phone calls. Get up, stretch, and walk around every 30 minutes throughout the day. Enjoy exercise with a friend. Support to continue exercising will help you keep a regular routine of activity. What guidelines can I follow while exercising? Before you start a new exercise program, talk with your health care provider. Do not exercise so much that you hurt yourself, feel dizzy, or get very short of breath. Wear comfortable clothes and wear shoes with good support. Drink plenty of water while you exercise to prevent   dehydration or heat stroke. Work out until your breathing and your  heartbeat get faster. Where to find more information [place] Department of Health and Human Services: www.hhs.gov Centers for Disease Control and Prevention (CDC): www.cdc.gov Summary Exercising regularly is important. It will improve your overall fitness, flexibility, and endurance. Regular exercise also will improve your overall health. It can help you control your weight, reduce stress, and improve your bone density. Do not exercise so much that you hurt yourself, feel dizzy, or get very short of breath. Before you start a new exercise program, talk with your health care provider. This information is not intended to replace advice given to you by your health care provider. Make sure you discuss any questions you have with your healthcare provider. Document Revised: 08/10/2020 Document Reviewed: 08/10/2020 Elsevier Patient Education  2022 Elsevier Inc. Budget-Friendly Healthy Eating There are many ways to save money at the grocery store and continue to eat healthy. You can be successful if you: Plan meals according to your budget. Make a grocery list and only purchase food according to your grocery list. Prepare food yourself at home. What are tips for following this plan? Reading food labels Compare food labels between brand name foods and the store brand. Often the nutritional value is the same, but the store brand is lower cost. Look for products that do not have added sugar, fat, or salt (sodium). These often cost the same but are healthier for you. Products may be labeled as: Sugar-free. Nonfat. Low-fat. Sodium-free. Low-sodium. Look for lean ground beef labeled as at least 92% lean and 8% fat. Shopping  Buy only the items on your grocery list and go only to the areas of the store that have the items on your list. Use coupons only for foods and brands you normally buy. Avoid buying items you wouldn't normally buy simply because they are on sale. Check online and in newspapers for  weekly deals. Buy healthy items from the bulk bins when available, such as herbs, spices, flour, pasta, nuts, and dried fruit. Buy fruits and vegetables that are in season. Prices are usually lower on in-season produce. Look at the unit price on the price tag. Use it to compare different brands and sizes to find out which item is the best deal. Choose healthy items that are often low-cost, such as carrots, potatoes, apples, bananas, and oranges. Dried or canned beans are a low-cost protein source. Buy in bulk and freeze extra food. Items you can buy in bulk include meats, fish, poultry, frozen fruits, and frozen vegetables. Avoid buying "ready-to-eat" foods, such as pre-cut fruits and vegetables and pre-made salads. If possible, shop around to discover where you can find the best prices. Consider other retailers such as dollar stores, larger wholesale stores, local fruit and vegetable stands, and farmers markets. Do not shop when you are hungry. If you shop while hungry, it may be hard to stick to your list and budget. Resist impulse buying. Use your grocery list as your official plan for the week. Buy a variety of vegetables and fruits by purchasing fresh, frozen, and canned items. Look at the top and bottom shelves for deals. Foods at eye level (eye level of an adult or child) are usually more expensive. Be efficient with your time when shopping. The more time you spend at the store, the more money you are likely to spend. To save money when choosing more expensive foods like meats and dairy: Choose cheaper cuts of meat, such as bone-in   chicken thighs and drumsticks instead of skinless and boneless chicken. When you are ready to prepare the chicken, you can remove the skin yourself to make it healthier. Choose lean meats like chicken or [place] instead of beef. Choose canned seafood, such as tuna, salmon, or sardines. Buy eggs as a low-cost source of protein. Buy dried beans and peas, such as  lentils, split peas, or kidney beans instead of meats. Dried beans and peas are a good alternative source of protein. Buy the larger tubs of yogurt instead of individual-sized containers. Choose water instead of sodas and other sweetened beverages. Avoid buying chips, cookies, and other "junk food." These items are usually expensive and not healthy.  Cooking Make extra food and freeze the extras in meal-sized containers or in individual portions for fast meals and snacks. Pre-cook on days when you have extra time to prepare meals in advance. You can keep these meals in the fridge or freezer and reheat for a quick meal. When you come home from the grocery store, wash, peel, and cut fruits and vegetables so they are ready to use and eat. This will help reduce food waste. Meal planning Do not eat out or get fast food. Prepare food at home. Make a grocery list and make sure to bring it with you to the store. If you have a smart phone, you could use your phone to create your shopping list. Plan meals and snacks according to a grocery list and budget you create. Use leftovers in your meal plan for the week. Look for recipes where you can cook once and make enough food for two meals. Prepare budget-friendly types of meals like stews, casseroles, and stir-fry dishes. Try some meatless meals or try "no cook" meals like salads. Make sure that half your plate is filled with fruits or vegetables. Choose from fresh, frozen, or canned fruits and vegetables. If eating canned, remember to rinse them before eating. This will remove any excess salt added for packaging. Summary Eating healthy on a budget is possible if you plan your meals according to your budget, purchase according to your budget and grocery list, and prepare food yourself. Tips for buying more food on a limited budget include buying generic brands, using coupons only for foods you normally buy, and buying healthy items from the bulk bins when  available. Tips for buying cheaper food to replace expensive food include choosing cheaper, lean cuts of meat, and buying dried beans and peas. This information is not intended to replace advice given to you by your health care provider. Make sure you discuss any questions you have with your healthcare provider. Document Revised: 06/07/2020 Document Reviewed: 06/07/2020 Elsevier Patient Education  2022 [employer].

## 2021-03-06 LAB — CBC
Hematocrit: 40.8 % (ref 34.0–46.6)
Hemoglobin: 13.9 g/dL (ref 11.1–15.9)
MCH: 32.7 pg (ref 26.6–33.0)
MCHC: 34.1 g/dL (ref 31.5–35.7)
MCV: 96 fL (ref 79–97)
Platelets: 305 10*3/uL (ref 150–450)
RBC: 4.25 x10E6/uL (ref 3.77–5.28)
RDW: 12.4 % (ref 11.7–15.4)
WBC: 7.2 10*3/uL (ref 3.4–10.8)

## 2021-03-06 LAB — VITAMIN B12: Vitamin B-12: >2000 pg/mL — ABNORMAL HIGH (ref 232–1245)

## 2021-03-06 LAB — TSH+FREE T4
Free T4: 1.07 ng/dL (ref 0.82–1.77)
TSH: 0.996 u[IU]/mL (ref 0.450–4.500)

## 2021-03-06 LAB — VITAMIN D 25 HYDROXY (VIT D DEFICIENCY, FRACTURES): Vit D, 25-Hydroxy: 37.8 ng/mL (ref 30.0–100.0)

## 2021-03-06 LAB — FERRITIN: Ferritin: 36 ng/mL (ref 15–150)

## 2021-05-13 ENCOUNTER — Emergency Department
Admission: EM | Admit: 2021-05-13 | Discharge: 2021-05-13 | Disposition: A | Discharge status: Home / Self Care | Payer: BC Managed Care – PPO | Attending: Emergency Medicine | Admitting: Emergency Medicine

## 2021-05-13 ENCOUNTER — Other Ambulatory Visit (HOSPITAL_COMMUNITY): Payer: Self-pay

## 2021-05-13 ENCOUNTER — Ambulatory Visit
Admission: EM | Admit: 2021-05-13 | Discharge: 2021-05-13 | Disposition: A | Discharge status: Home / Self Care | Payer: No Typology Code available for payment source | Source: Ambulatory Visit | Attending: Emergency Medicine | Admitting: Emergency Medicine

## 2021-05-13 ENCOUNTER — Other Ambulatory Visit: Payer: Self-pay

## 2021-05-13 DIAGNOSIS — Z0441 Encounter for examination and observation following alleged adult rape: Secondary | ICD-10-CM | POA: Insufficient documentation

## 2021-05-13 DIAGNOSIS — T7421XA Adult sexual abuse, confirmed, initial encounter: Secondary | ICD-10-CM | POA: Insufficient documentation

## 2021-05-13 LAB — RAPID HIV SCREEN (HIV 1/2 AB+AG)
HIV 1/2 Antibodies: NONREACTIVE
HIV-1 P24 Antigen - HIV24: NONREACTIVE

## 2021-05-13 LAB — COMPREHENSIVE METABOLIC PANEL
ALT: 26 U/L (ref 0–44)
AST: 29 U/L (ref 15–41)
Albumin: 4.9 g/dL (ref 3.5–5.0)
Alkaline Phosphatase: 51 U/L (ref 38–126)
Anion gap: 7 (ref 5–15)
BUN: 10 mg/dL (ref 6–20)
CO2: 23 mmol/L (ref 22–32)
Calcium: 9.5 mg/dL (ref 8.9–10.3)
Chloride: 108 mmol/L (ref 98–111)
Creatinine, Ser: 0.68 mg/dL (ref 0.44–1.00)
GFR, Estimated: >60 mL/min (ref >60)
Glucose, Bld: 126 mg/dL — ABNORMAL HIGH (ref 70–99)
Potassium: 4.2 mmol/L (ref 3.5–5.1)
Sodium: 138 mmol/L (ref 135–145)
Total Bilirubin: 0.6 mg/dL (ref 0.3–1.2)
Total Protein: 8.6 g/dL — ABNORMAL HIGH (ref 6.5–8.1)

## 2021-05-13 LAB — HEPATITIS B SURFACE ANTIGEN: Hepatitis B Surface Ag: NONREACTIVE

## 2021-05-13 LAB — POC URINE PREG, ED: Preg Test, Ur: NEGATIVE

## 2021-05-13 MED ORDER — AZITHROMYCIN 500 MG PO TABS
1000.0000 mg | ORAL_TABLET | Freq: Once | ORAL | Status: AC
  Administered 2021-05-13: 1000 mg via ORAL
  Filled 2021-05-13: qty 2

## 2021-05-13 MED ORDER — METRONIDAZOLE 500 MG PO TABS
2000.0000 mg | ORAL_TABLET | Freq: Once | ORAL | Status: AC
  Administered 2021-05-13: 2000 mg via ORAL
  Filled 2021-05-13: qty 4

## 2021-05-13 MED ORDER — ELVITEG-COBIC-EMTRICIT-TENOFAF 150-150-200-10 MG PO TABS
1.0000 | ORAL_TABLET | Freq: Every day | ORAL | 0 refills | Status: DC
  Filled 2021-05-13: qty 30, 30d supply, fill #0

## 2021-05-13 MED ORDER — LIDOCAINE HCL (PF) 1 % IJ SOLN
1.0000 mL | Freq: Once | INTRAMUSCULAR | Status: AC
  Administered 2021-05-13: 1 mL
  Filled 2021-05-13: qty 5

## 2021-05-13 MED ORDER — CEFTRIAXONE SODIUM 1 G IJ SOLR
500.0000 mg | Freq: Once | INTRAMUSCULAR | Status: AC
  Administered 2021-05-13: 500 mg via INTRAMUSCULAR
  Filled 2021-05-13: qty 10

## 2021-05-13 MED ORDER — ELVITEG-COBIC-EMTRICIT-TENOFAF 150-150-200-10 MG PREPACK
1.0000 | ORAL_TABLET | Freq: Once | ORAL | Status: AC
  Administered 2021-05-13: 1 via ORAL
  Filled 2021-05-13: qty 1

## 2021-05-13 NOTE — ED Notes (Signed)
Blood work collected by this [employer] fundraiser. Pt taken for exam by Amy, SANE RN.
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
## 2021-05-13 NOTE — ED Notes (Signed)
SANE RN in with pt.
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
## 2021-05-13 NOTE — Consult Note (Signed)
The SANE/FNE (Forensic Nurse Examiner) consult has been completed. The provider has been notified. Please contact the SANE/FNE nurse on call (listed in Amion) with any further concerns.  

## 2021-05-14 ENCOUNTER — Other Ambulatory Visit (HOSPITAL_COMMUNITY): Payer: Self-pay

## 2021-05-14 ENCOUNTER — Telehealth: Payer: Self-pay | Admitting: Emergency Medicine

## 2021-05-14 LAB — RPR
RPR Ser Ql: REACTIVE — AB
RPR Titer: 1:1 {titer}

## 2021-05-14 LAB — HEPATITIS C ANTIBODY: HCV Ab: NONREACTIVE

## 2021-05-14 NOTE — Telephone Encounter (Signed)
Called patient to inform of rpr result and need for treatment.  She called me right back.  She says she saw the result and is about to call [facility] for treatment.

## 2021-05-15 ENCOUNTER — Encounter: Payer: Self-pay | Admitting: Obstetrics and Gynecology

## 2021-05-15 LAB — T.PALLIDUM AB, TOTAL: T Pallidum Abs: NONREACTIVE

## 2021-05-16 ENCOUNTER — Other Ambulatory Visit: Payer: Self-pay | Admitting: Obstetrics and Gynecology

## 2021-05-16 MED ORDER — ONDANSETRON 4 MG PO TBDP: 4.0000 mg | ORAL_TABLET | Freq: Four times a day (QID) | ORAL | 1 refills | Status: DC | PRN

## 2021-05-16 NOTE — Progress Notes (Signed)
Rx zofran for nausea with HIV preventive med

## 2021-05-20 ENCOUNTER — Ambulatory Visit (INDEPENDENT_AMBULATORY_CARE_PROVIDER_SITE_OTHER): Payer: Self-pay | Admitting: Obstetrics and Gynecology

## 2021-05-20 ENCOUNTER — Other Ambulatory Visit: Payer: Self-pay

## 2021-05-20 ENCOUNTER — Encounter: Payer: Self-pay | Admitting: Obstetrics and Gynecology

## 2021-05-20 ENCOUNTER — Other Ambulatory Visit (HOSPITAL_COMMUNITY)
Admission: RE | Admit: 2021-05-20 | Discharge: 2021-05-20 | Disposition: A | Discharge status: Home / Self Care | Payer: BC Managed Care – PPO | Source: Ambulatory Visit | Attending: Obstetrics and Gynecology | Admitting: Obstetrics and Gynecology

## 2021-05-20 VITALS — BP 104/64 | Ht 63.0 in | Wt 169.0 lb

## 2021-05-20 DIAGNOSIS — Z202 Contact with and (suspected) exposure to infections with a predominantly sexual mode of transmission: Secondary | ICD-10-CM | POA: Diagnosis present

## 2021-05-20 DIAGNOSIS — Z113 Encounter for screening for infections with a predominantly sexual mode of transmission: Secondary | ICD-10-CM
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
## 2021-05-22 LAB — CERVICOVAGINAL ANCILLARY ONLY
Chlamydia: NEGATIVE
Comment: NEGATIVE
Comment: NEGATIVE
Comment: NORMAL
Neisseria Gonorrhea: NEGATIVE
Trichomonas: NEGATIVE

## 2021-06-22 ENCOUNTER — Encounter: Payer: Self-pay | Admitting: Obstetrics and Gynecology

## 2021-09-05 ENCOUNTER — Other Ambulatory Visit: Payer: Self-pay | Admitting: Obstetrics

## 2021-09-05 DIAGNOSIS — F411 Generalized anxiety disorder: Secondary | ICD-10-CM

## 2021-10-28 ENCOUNTER — Other Ambulatory Visit: Payer: Self-pay | Admitting: Obstetrics

## 2021-10-28 DIAGNOSIS — F411 Generalized anxiety disorder: Secondary | ICD-10-CM

## 2021-11-20 ENCOUNTER — Other Ambulatory Visit: Payer: Self-pay | Admitting: Obstetrics

## 2021-11-20 DIAGNOSIS — F411 Generalized anxiety disorder: Secondary | ICD-10-CM

## 2022-01-27 ENCOUNTER — Encounter: Payer: Self-pay | Admitting: Obstetrics and Gynecology

## 2022-11-28 ENCOUNTER — Ambulatory Visit
Admission: EM | Admit: 2022-11-28 | Discharge: 2022-11-28 | Disposition: A | Discharge status: Home / Self Care | Payer: BC Managed Care – PPO | Attending: Emergency Medicine | Admitting: Emergency Medicine

## 2022-11-28 DIAGNOSIS — R350 Frequency of micturition: Secondary | ICD-10-CM

## 2022-11-28 DIAGNOSIS — R300 Dysuria: Secondary | ICD-10-CM

## 2022-11-28 DIAGNOSIS — Z3202 Encounter for pregnancy test, result negative: Secondary | ICD-10-CM

## 2022-11-28 HISTORY — DX: Psoriasis, unspecified: L40.9

## 2022-11-28 LAB — POCT URINALYSIS DIP (MANUAL ENTRY)
Bilirubin, UA: NEGATIVE
Blood, UA: NEGATIVE
Glucose, UA: NEGATIVE mg/dL
Leukocytes, UA: NEGATIVE
Nitrite, UA: NEGATIVE
Protein Ur, POC: NEGATIVE mg/dL
Spec Grav, UA: 1.025 (ref 1.010–1.025)
Urobilinogen, UA: 0.2 E.U./dL
pH, UA: 7 (ref 5.0–8.0)

## 2022-11-28 LAB — POCT URINE PREGNANCY: Preg Test, Ur: NEGATIVE

## 2022-11-28 NOTE — Discharge Instructions (Addendum)
Your urine does not show signs of infection today.  Your urine pregnancy test is negative.  Follow up with your primary care provider if your symptoms are not improving.

## 2022-11-28 NOTE — ED Triage Notes (Signed)
Patient to Urgent Care with complaints of urinary frequency/ suprapubic pressure. Denies any dysuria. Denies any abnormal vaginal discharge.  Symptoms started yesterday.

## 2022-11-28 NOTE — ED Provider Notes (Signed)
[name]    CSN: [id number] Arrival date & time: 11/28/22  1516      History   Chief Complaint Chief Complaint  Patient presents with   Urinary Frequency    HPI Melissa Donaldson is a 27 y.o. female.  Patient presents with dysuria, urinary frequency, bladder pressure with urination x 1 day.  No OTC medications taken.  She denies abdominal pain, vomiting, diarrhea, constipation, vaginal discharge, pelvic pain, flank pain, hematuria, or other symptoms.  The history is provided by the patient and medical records.    Past Medical History:  Diagnosis Date   Abdominal pain    Bell palsy    Psoriasis     Patient Active Problem List   Diagnosis Date Noted   At increased risk of breast cancer 03/05/2021   Migraine variant 12/03/2012   Nausea 09/28/2012   Generalized abdominal pain     Past Surgical History:  Procedure Laterality Date   NO PAST SURGERIES      OB History     Gravida  0   Para  0   Term  0   Preterm  0   AB  0   Living  0      SAB  0   IAB  0   Ectopic  0   Multiple  0   Live Births  0            Home Medications    Prior to Admission medications   Medication Sig Start Date End Date Taking? Authorizing Provider  elvitegravir-cobicistat-emtricitabine-tenofovir (GENVOYA) 150-150-200-10 MG TABS tablet Take 1 tablet by mouth daily with breakfast. Patient not taking: Reported on 11/28/2022 05/13/21   Arta Silence, MD  loratadine (CLARITIN) 10 MG tablet Take 10 mg by mouth daily.    [provider]  Multiple Vitamin (MULTIVITAMIN) tablet Take 1 tablet by mouth daily.    [provider]  Norethindrone-Ethinyl Estradiol-Fe (LAYOLIS FE) 0.8-25 MG-MCG tablet Chew 1 tablet by mouth daily. Patient not taking: Reported on 11/28/2022 03/05/21   Homero Fellers, MD  ondansetron (ZOFRAN ODT) 4 MG disintegrating tablet Take 1 tablet (4 mg total) by mouth every 6 (six) hours as needed for nausea. 0000000    Copland, Elmo Putt B, PA-C  sertraline (ZOLOFT) 50 MG tablet TAKE 1 TABLET BY MOUTH ONCE DAILY (TAKE  ALONG  WITH  25MG   TAB  FOR  TOTAL  DAILY  DOSE  OF  75MG ) 09/05/21   Imagene Riches, CNM    Family History Family History  Problem Relation Age of Onset   Migraines Brother    Breast cancer Paternal Grandmother 26   Migraines Maternal Aunt     Social History Social History   Tobacco Use   Smoking status: Never   Smokeless tobacco: Never  Vaping Use   Vaping Use: Never used  Substance Use Topics   Alcohol use: No   Drug use: No     Allergies   Amoxicillin   Review of Systems Review of Systems  Constitutional:  Negative for chills and fever.  Gastrointestinal:  Negative for abdominal pain, constipation, diarrhea, nausea and vomiting.  Genitourinary:  Positive for dysuria and frequency. Negative for flank pain, hematuria, pelvic pain and vaginal discharge.  Skin:  Negative for rash.  All other systems reviewed and are negative.    Physical Exam Triage Vital Signs ED Triage Vitals  Enc Vitals Group     BP      Pulse  Resp      Temp      Temp src      SpO2      Weight      Height      Head Circumference      Peak Flow      Pain Score      Pain Loc      Pain Edu?      Excl. in [place]?    No data found.  Updated Vital Signs BP 108/70   Pulse 73   Temp 97.6 F (36.4 C)   Resp 18   Ht 5\' 3"  (1.6 m)   Wt 157 lb (71.2 kg)   LMP 11/16/2022   SpO2 97%   BMI 27.81 kg/m   Visual Acuity Right Eye Distance:   Left Eye Distance:   Bilateral Distance:    Right Eye Near:   Left Eye Near:    Bilateral Near:     Physical Exam Vitals and nursing note reviewed.  Constitutional:      General: She is not in acute distress.    Appearance: Normal appearance. She is well-developed. She is not ill-appearing.  HENT:     Mouth/Throat:     Mouth: Mucous membranes are moist.  Cardiovascular:     Rate and Rhythm: Normal rate and regular rhythm.     Heart  sounds: Normal heart sounds.  Pulmonary:     Effort: Pulmonary effort is normal. No respiratory distress.     Breath sounds: Normal breath sounds.  Abdominal:     General: Bowel sounds are normal.     Palpations: Abdomen is soft.     Tenderness: There is no abdominal tenderness. There is no right CVA tenderness, left CVA tenderness, guarding or rebound.  Musculoskeletal:     Cervical back: Neck supple.  Skin:    General: Skin is warm and dry.  Neurological:     Mental Status: She is alert.  Psychiatric:        Mood and Affect: Mood normal.        Behavior: Behavior normal.      UC Treatments / Results  Labs (all labs ordered are listed, but only abnormal results are displayed) Labs Reviewed  POCT URINALYSIS DIP (MANUAL ENTRY) - Abnormal; Notable for the following components:      Result Value   Ketones, POC UA trace (5) (*)    All other components within normal limits  POCT URINE PREGNANCY    EKG   Radiology No results found.  Procedures Procedures (including critical care time)  Medications Ordered in UC Medications - No data to display  Initial Impression / Assessment and Plan / UC Course  I have reviewed the triage vital signs and the nursing notes.  Pertinent labs & imaging results that were available during my care of the patient were reviewed by me and considered in my medical decision making (see chart for details).    Dysuria, urinary frequency, negative pregnancy test.  Urine does not indicate infection.  Discussed symptomatic treatment including increased water intake and OTC Azo.  Instructed patient to follow up with her PCP if her symptoms are not improving.  Education provided on dysuria and urinary frequency.  She agrees to plan of care.    Final Clinical Impressions(s) / UC Diagnoses   Final diagnoses:  Dysuria  Urinary frequency  Negative pregnancy test     Discharge Instructions      Your urine does not show signs of  infection today.   Your urine pregnancy test is negative.  Follow up with your primary care provider if your symptoms are not improving.        ED Prescriptions   None    PDMP not reviewed this encounter.   Sharion Balloon, NP 11/28/22 [phone]

## 2023-01-20 ENCOUNTER — Telehealth: Payer: Self-pay

## 2023-01-20 NOTE — Telephone Encounter (Signed)
Left message for patient to call office back to schedule annual appt 

## 2023-04-01 NOTE — Telephone Encounter (Signed)
As of 04/01/2023, pt had not called in to schedule her annual exam.

## 2023-11-07 DIAGNOSIS — Z1371 Encounter for nonprocreative screening for genetic disease carrier status: Secondary | ICD-10-CM

## 2023-11-07 DIAGNOSIS — Z9189 Other specified personal risk factors, not elsewhere classified: Secondary | ICD-10-CM

## 2023-11-07 HISTORY — DX: Encounter for nonprocreative screening for genetic disease carrier status: Z13.71

## 2023-11-07 HISTORY — DX: Other specified personal risk factors, not elsewhere classified: Z91.89

## 2023-11-09 NOTE — Progress Notes (Unsigned)
 PCP:  Erick Colace, MD   No chief complaint on file.    HPI:      [name] is a 28 y.o. G0P0000 whose LMP was No LMP recorded., presents today for her annual examination.  Her menses are {norm/abn:715}, lasting {number: 22536} days.  Dysmenorrhea {dysmen:716}. She {does:18564} have intermenstrual bleeding.  Sex activity: {sex active: 315163}. Was on OCPs Last Pap: 02/25/19 Results were: no abnormalities  Hx of STDs: {STD hx:14358}  There is no FH of breast cancer. There is no FH of ovarian cancer. The patient {does:18564} do self-breast exams.  Tobacco use: {tob:20664} Alcohol use: {Alcohol:11675} No drug use.  Exercise: {exercise:31265}  She {does:18564} get adequate calcium and Vitamin D in her diet.  Patient Active Problem List   Diagnosis Date Noted   At increased risk of breast cancer 03/05/2021   Migraine variant 12/03/2012   Nausea 09/28/2012   Generalized abdominal pain     Past Surgical History:  Procedure Laterality Date   NO PAST SURGERIES      Family History  Problem Relation Age of Onset   Migraines Brother    Breast cancer Paternal Grandmother 54   Migraines Maternal Aunt     Social History   Socioeconomic History   Marital status: Single    Spouse name: Not on file   Number of children: Not on file   Years of education: Not on file   Highest education level: Not on file  Occupational History   Not on file  Tobacco Use   Smoking status: Never   Smokeless tobacco: Never  Vaping Use   Vaping status: Never Used  Substance and Sexual Activity   Alcohol use: No   Drug use: No   Sexual activity: Not Currently    Birth control/protection: None, Condom  Other Topics Concern   Not on file  Social History Narrative   Not on file   Social Drivers of Health   Financial Resource Strain: Not on file  Food Insecurity: Not on file  Transportation Needs: Not on file  Physical Activity: Not on file  Stress: Not on file  Social  Connections: Not on file  Intimate Partner Violence: Not on file     Current Outpatient Medications:    elvitegravir-cobicistat-emtricitabine-tenofovir (GENVOYA) 150-150-200-10 MG TABS tablet, Take 1 tablet by mouth daily with breakfast. (Patient not taking: Reported on 11/28/2022), Disp: 30 tablet, Rfl: 0   loratadine (CLARITIN) 10 MG tablet, Take 10 mg by mouth daily., Disp: , Rfl:    Multiple Vitamin (MULTIVITAMIN) tablet, Take 1 tablet by mouth daily., Disp: , Rfl:    Norethindrone-Ethinyl Estradiol-Fe (LAYOLIS FE) 0.8-25 MG-MCG tablet, Chew 1 tablet by mouth daily. (Patient not taking: Reported on 11/28/2022), Disp: 28 tablet, Rfl: 11   ondansetron (ZOFRAN ODT) 4 MG disintegrating tablet, Take 1 tablet (4 mg total) by mouth every 6 (six) hours as needed for nausea., Disp: 30 tablet, Rfl: 1   sertraline (ZOLOFT) 50 MG tablet, TAKE 1 TABLET BY MOUTH ONCE DAILY (TAKE  ALONG  WITH  25MG   TAB  FOR  TOTAL  DAILY  DOSE  OF  75MG ), Disp: 60 tablet, Rfl: 0     ROS:  Review of Systems BREAST: No symptoms   Objective: There were no vitals taken for this visit.   OBGyn Exam  Results: No results found for this or any previous visit (from the past 24 hours).  Assessment/Plan: No diagnosis found.  No orders of the defined types  were placed in this encounter.            GYN counsel {counseling: 16159}     F/U  No follow-ups on file.  Ahsley Attwood B. Azara Gemme, PA-C 11/09/2023 8:28 PM

## 2023-11-10 ENCOUNTER — Encounter: Payer: Self-pay | Admitting: Obstetrics and Gynecology

## 2023-11-10 ENCOUNTER — Ambulatory Visit (INDEPENDENT_AMBULATORY_CARE_PROVIDER_SITE_OTHER): Payer: Self-pay | Admitting: Obstetrics and Gynecology

## 2023-11-10 ENCOUNTER — Other Ambulatory Visit (HOSPITAL_COMMUNITY)
Admission: RE | Admit: 2023-11-10 | Discharge: 2023-11-10 | Disposition: A | Discharge status: Home / Self Care | Source: Ambulatory Visit | Attending: Obstetrics and Gynecology | Admitting: Obstetrics and Gynecology

## 2023-11-10 VITALS — BP 108/66 | Ht 63.0 in | Wt 183.0 lb

## 2023-11-10 DIAGNOSIS — L680 Hirsutism: Secondary | ICD-10-CM

## 2023-11-10 DIAGNOSIS — Z803 Family history of malignant neoplasm of breast: Secondary | ICD-10-CM | POA: Insufficient documentation

## 2023-11-10 DIAGNOSIS — Z124 Encounter for screening for malignant neoplasm of cervix: Secondary | ICD-10-CM

## 2023-11-10 DIAGNOSIS — Z01419 Encounter for gynecological examination (general) (routine) without abnormal findings: Secondary | ICD-10-CM

## 2023-11-10 DIAGNOSIS — Z113 Encounter for screening for infections with a predominantly sexual mode of transmission: Secondary | ICD-10-CM

## 2023-11-10 NOTE — Patient Instructions (Signed)
 I value your feedback and you entrusting [place] with your care. If you get a [facility] [place] patient survey, I would appreciate you taking the time to let us know about your experience today. Thank you! ? ? ?

## 2023-11-12 LAB — CYTOLOGY - PAP: Diagnosis: NEGATIVE

## 2023-11-15 ENCOUNTER — Encounter: Payer: Self-pay | Admitting: Obstetrics and Gynecology

## 2023-11-15 LAB — TESTOSTERONE,FREE AND TOTAL
Testosterone, Free: 0.6 pg/mL (ref 0.0–4.2)
Testosterone: 13 ng/dL (ref 13–71)

## 2023-11-15 LAB — DHEA-SULFATE: DHEA-SO4: 126 ug/dL (ref 84.8–378.0)

## 2023-11-16 NOTE — Telephone Encounter (Signed)
 Done on duplicate message

## 2023-11-24 ENCOUNTER — Encounter: Payer: Self-pay | Admitting: Obstetrics and Gynecology

## 2023-12-22 NOTE — Telephone Encounter (Signed)
 Hard copy results mailed to pt. [facility] 12/10/23 to discuss extra screening, pt didn't call back. No need to start imaging until age 28 anyway.

## 2024-01-08 ENCOUNTER — Telehealth: Payer: Self-pay

## 2024-01-08 ENCOUNTER — Other Ambulatory Visit: Payer: Self-pay | Admitting: Obstetrics

## 2024-01-08 MED ORDER — SERTRALINE HCL 100 MG PO TABS: 100.0000 mg | ORAL_TABLET | Freq: Every day | ORAL | 0 refills | Status: AC

## 2024-01-08 NOTE — Telephone Encounter (Signed)
 Patient calling in stating she has ran out of Zoloft  refills and is unable to get in touch with PCP. She is calling to see if a refill can be sent in from us  as she has been without her medication for 3 days and is struggling with her anxiety already. Please advise patient if able

## 2024-01-08 NOTE — Progress Notes (Signed)
Refill sent for sertraline

## 2024-06-02 ENCOUNTER — Ambulatory Visit (INDEPENDENT_AMBULATORY_CARE_PROVIDER_SITE_OTHER)

## 2024-06-02 ENCOUNTER — Other Ambulatory Visit (HOSPITAL_COMMUNITY)
Admission: RE | Admit: 2024-06-02 | Discharge: 2024-06-02 | Disposition: A | Discharge status: Home / Self Care | Source: Ambulatory Visit | Attending: Obstetrics | Admitting: Obstetrics

## 2024-06-02 VITALS — BP 107/63 | HR 79 | Ht 63.0 in | Wt 172.4 lb

## 2024-06-02 DIAGNOSIS — N898 Other specified noninflammatory disorders of vagina: Secondary | ICD-10-CM | POA: Diagnosis present

## 2024-06-02 NOTE — Patient Instructions (Signed)

## 2024-06-02 NOTE — Progress Notes (Signed)
    NURSE VISIT NOTE  Subjective:    Patient ID: Melissa Donaldson, female    DOB: Jul 03, 1996, 28 y.o.   MRN: [id number]  HPI  Patient is a 28 y.o. G0P0000 female who presents for vaginal itching for 1 week(s). Denies abnormal vaginal bleeding or significant pelvic pain or fever. denies Uti Symptoms. Patient denies history of known exposure to STD.   Objective:    BP 107/63   Pulse 79   Ht 5' 3 (1.6 m)   Wt 172 lb 6.4 oz (78.2 kg)   BMI 30.54 kg/m      Assessment:   1. Vaginal itching     nonspecific vaginitis  Plan:   GC and chlamydia DNA  probe sent to lab. Treatment: Will await test results ROV prn if symptoms persist or worsen.   Camelia Bars, LPN

## 2024-06-06 ENCOUNTER — Ambulatory Visit: Payer: Self-pay | Admitting: Obstetrics and Gynecology

## 2024-06-06 LAB — CERVICOVAGINAL ANCILLARY ONLY
Bacterial Vaginitis (gardnerella): NEGATIVE
Candida Glabrata: NEGATIVE
Candida Vaginitis: POSITIVE — AB
Chlamydia: NEGATIVE
Comment: NEGATIVE
Comment: NEGATIVE
Comment: NEGATIVE
Comment: NEGATIVE
Comment: NEGATIVE
Comment: NORMAL
Neisseria Gonorrhea: NEGATIVE
Trichomonas: NEGATIVE

## 2024-06-06 MED ORDER — FLUCONAZOLE 150 MG PO TABS: 150.0000 mg | ORAL_TABLET | Freq: Once | ORAL | 0 refills | Status: AC

## 2024-06-06 NOTE — Addendum Note (Signed)
 Addended by: WATT HILA B on: 06/06/2024 02:54 PM   Modules accepted: Orders
